# Patient Record
Sex: Male | Born: 2005 | Race: White | Hispanic: No | Marital: Single | Smoking: Current every day smoker
Health system: Southern US, Community
[De-identification: ages and names within clinical notes are randomized; demographics above are authoritative.]

## PROBLEM LIST (undated history)

## (undated) DIAGNOSIS — F4310 Post-traumatic stress disorder, unspecified: Secondary | ICD-10-CM

## (undated) DIAGNOSIS — F909 Attention-deficit hyperactivity disorder, unspecified type: Secondary | ICD-10-CM

---
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
## 2024-01-15 ENCOUNTER — Ambulatory Visit: Admitting: Pediatrics

## 2024-01-20 ENCOUNTER — Telehealth: Admitting: Pediatrics

## 2024-01-20 ENCOUNTER — Encounter: Payer: Self-pay | Admitting: Pediatrics

## 2024-01-20 VITALS — Ht 68.03 in

## 2024-01-20 DIAGNOSIS — F4323 Adjustment disorder with mixed anxiety and depressed mood: Secondary | ICD-10-CM

## 2024-01-20 NOTE — Assessment & Plan Note (Signed)
 Prozac  20 mg effective. Prefers current dose. Will f/u in 4 weeks in case of dosage change need before PCP change. - Continue Prozac  20 mg daily.

## 2024-01-20 NOTE — Progress Notes (Signed)
   Telehealth Visit  I connected with  Edwin JONETTA Gerome Mickey. on 01/20/24 by a video enabled telemedicine application and verified that I am speaking with the correct person using two identifiers.   I discussed the limitations of evaluation and management by telemedicine. The patient expressed understanding and agreed to proceed.  Subjective:    Patient ID: Edwin JONETTA Gerome Mickey., male    DOB: 2005/05/02, 18 y.o.   MRN: [id number]  HPI: Edwin Etchison. is a 18 y.o. male  No chief complaint on file.   Discussed the use of AI scribe software for clinical note transcription with the patient, who gave verbal consent to proceed.  History of Present Illness   Edwin JONETTA Gerome Mickey. Edwin Stark is a 18 year old male who presents for follow-up regarding his Prozac  medication.  He reports that he thinks the Prozac  is working. He is currently taking a dose of 20 mg and wishes to continue with this dosage. He does not want to increase the dose at this time.      Relevant past medical, surgical, family and social history reviewed and updated as indicated. Interim medical history since our last visit reviewed. Allergies and medications reviewed and updated.  ROS per HPI unless specifically indicated above     Objective:    Ht 5' 8.03 (1.728 m)   BMI 32.87 kg/m   Wt Readings from Last 3 Encounters:  10/16/23 216 lb 6.4 oz (98.2 kg) (97%, Z= 1.85)*  09/15/23 213 lb 3.2 oz (96.7 kg) (96%, Z= 1.80)*  05/10/23 220 lb (99.8 kg) (97%, Z= 1.96)*   * Growth percentiles are based on CDC (Boys, 2-20 Years) data.     Physical Exam Constitutional:      General: He is not in acute distress.    Appearance: He is normal weight. He is not ill-appearing.  Pulmonary:     Effort: Pulmonary effort is normal.  Neurological:     General: No focal deficit present.     Mental Status: He is alert. Mental status is at baseline.  Psychiatric:        Mood and Affect: Mood normal.        Behavior:  Behavior normal.      LIMITED EXAM GIVEN VIDEO VISIT     Assessment & Plan:  Assessment & Plan   Adjustment disorder with mixed anxiety and depressed mood Assessment & Plan: Prozac  20 mg effective. Prefers current dose. Will f/u in 4 weeks in case of dosage change need before PCP change. - Continue Prozac  20 mg daily.     Follow up plan: 4 weeks  [name], MD   This visit was completed via video visit through MyChart due to the restrictions of the COVID-19 pandemic. All issues as above were discussed and addressed. Physical exam was done as above through visual confirmation on video through MyChart. If it was felt that the patient should be evaluated in the office, they were directed there. The patient verbally consented to this visit.  Location of the patient: home Location of the provider: work Those involved with this call:  Provider: Hadassah Nett, MD CMA: Joya Louder, CMA Time spent on call: 10 minutes with patient face to face via video conference. More than 50% of this time was spent in counseling and coordination of care. 10 minutes total spent in review of patient's record and preparation of their chart. Total time spent on this encounter: 20 minutes.

## 2024-02-24 ENCOUNTER — Ambulatory Visit: Admitting: Pediatrics

## 2024-03-05 NOTE — Patient Instructions (Incomplete)
 Healthy Eating, Adult Healthy eating may help you get and keep a healthy body weight, reduce the risk of chronic disease, and live a long and productive life. It is important to follow a healthy eating pattern. Your nutritional and calorie needs should be met mainly by different nutrient-rich foods. What are tips for following this plan? Reading food labels Read labels and choose the following: Reduced or low sodium products. Juices with 100% fruit juice. Foods with low saturated fats (<3 g per serving) and high polyunsaturated and monounsaturated fats. Foods with whole grains, such as whole wheat, cracked wheat, brown rice, and wild rice. Whole grains that are fortified with folic acid . This is recommended for females who are pregnant or who want to become pregnant. Read labels and do not eat or drink the following: Foods or drinks with added sugars. These include foods that contain brown sugar, corn sweetener, corn syrup, dextrose , fructose, glucose, high-fructose corn syrup, honey, invert sugar, lactose, malt syrup, maltose, molasses, raw sugar, sucrose, trehalose, or turbinado sugar. Limit your intake of added sugars to less than 10% of your total daily calories. Do not eat more than the following amounts of added sugar per day: 6 teaspoons (25 g) for females. 9 teaspoons (38 g) for males. Foods that contain processed or refined starches and grains. Refined grain products, such as white flour, degermed cornmeal, white bread, and white rice. Shopping Choose nutrient-rich snacks, such as vegetables, whole fruits, and nuts. Avoid high-calorie and high-sugar snacks, such as potato chips, fruit snacks, and candy. Use oil-based dressings and spreads on foods instead of solid fats such as butter, margarine, sour cream, or cream cheese. Limit pre-made sauces, mixes, and instant products such as flavored rice, instant noodles, and ready-made pasta. Try more plant-protein sources, such as tofu,  tempeh, black beans, edamame, lentils, nuts, and seeds. Explore eating plans such as the Mediterranean diet or vegetarian diet. Try heart-healthy dips made with beans and healthy fats like hummus and guacamole. Vegetables go great with these. Cooking Use oil to saut or stir-fry foods instead of solid fats such as butter, margarine, or lard. Try baking, boiling, grilling, or broiling instead of frying. Remove the fatty part of meats before cooking. Steam vegetables in water  or broth. Meal planning  At meals, imagine dividing your plate into fourths: One-half of your plate is fruits and vegetables. One-fourth of your plate is whole grains. One-fourth of your plate is protein, especially lean meats, poultry, eggs, tofu, beans, or nuts. Include low-fat dairy as part of your daily diet. Lifestyle Choose healthy options in all settings, including home, work, school, restaurants, or stores. Prepare your food safely: Wash your hands after handling raw meats. Where you prepare food, keep surfaces clean by regularly washing with hot, soapy water . Keep raw meats separate from ready-to-eat foods, such as fruits and vegetables. Cook seafood, meat, poultry, and eggs to the recommended temperature. Get a food thermometer. Store foods at safe temperatures. In general: Keep cold foods at 34F (4.4C) or below. Keep hot foods at 134F (60C) or above. Keep your freezer at [facility] (-17.8C) or below. Foods are not safe to eat if they have been between the temperatures of 40-134F (4.4-60C) for more than 2 hours. What foods should I eat? Fruits Aim to eat 1-2 cups of fresh, canned (in natural juice), or frozen fruits each day. One cup of fruit equals 1 small apple, 1 large banana, 8 large strawberries, 1 cup (237 g) canned fruit,  cup (82 g) dried fruit,  or 1 cup (240 mL) 100% juice. Vegetables Aim to eat 2-4 cups of fresh and frozen vegetables each day, including different varieties and colors. One cup  of vegetables equals 1 cup (91 g) broccoli or cauliflower florets, 2 medium carrots, 2 cups (150 g) raw, leafy greens, 1 large tomato, 1 large bell pepper, 1 large sweet potato, or 1 medium white potato. Grains Aim to eat 5-10 ounce-equivalents of whole grains each day. Examples of 1 ounce-equivalent of grains include 1 slice of bread, 1 cup (40 g) ready-to-eat cereal, 3 cups (24 g) popcorn, or  cup (93 g) cooked rice. Meats and other proteins Try to eat 5-7 ounce-equivalents of protein each day. Examples of 1 ounce-equivalent of protein include 1 egg,  oz nuts (12 almonds, 24 pistachios, or 7 walnut halves), 1/4 cup (90 g) cooked beans, 6 tablespoons (90 g) hummus or 1 tablespoon (16 g) peanut butter. A cut of meat or fish that is the size of a deck of cards is about 3-4 ounce-equivalents (85 g). Of the protein you eat each week, try to have at least 8 sounce (227 g) of seafood. This is about 2 servings per week. This includes salmon, trout, herring, sardines, and anchovies. Dairy Aim to eat 3 cup-equivalents of fat-free or low-fat dairy each day. Examples of 1 cup-equivalent of dairy include 1 cup (240 mL) milk, 8 ounces (250 g) yogurt, 1 ounces (44 g) natural cheese, or 1 cup (240 mL) fortified soy milk. Fats and oils Aim for about 5 teaspoons (21 g) of fats and oils per day. Choose monounsaturated fats, such as canola and olive oils, mayonnaise made with olive oil or avocado oil, avocados, peanut butter, and most nuts, or polyunsaturated fats, such as sunflower, corn, and soybean oils, walnuts, pine nuts, sesame seeds, sunflower seeds, and flaxseed. Beverages Aim for 6 eight-ounce glasses of water  per day. Limit coffee to 3-5 eight-ounce cups per day. Limit caffeinated beverages that have added calories, such as soda and energy drinks. If you drink alcohol: Limit how much you have to: 0-1 drink a day if you are male. 0-2 drinks a day if you are male. Know how much alcohol is in your drink.  In the [place] one drink is one 12 oz bottle of beer (355 mL), one 5 oz glass of wine (148 mL), or one 1 oz glass of hard liquor (44 mL). Seasoning and other foods Try not to add too much salt to your food. Try using herbs and spices instead of salt. Try not to add sugar to food. This information is based on [place] nutrition guidelines. To learn more, visit [employer]. Exact amounts may vary. You may need different amounts. This information is not intended to replace advice given to you by your health care provider. Make sure you discuss any questions you have with your health care provider. Document Revised: 11/11/2021 Document Reviewed: 11/11/2021 Elsevier Patient Education  2024 [employer].

## 2024-03-11 ENCOUNTER — Ambulatory Visit: Admitting: Nurse Practitioner
